# Patient Record
Sex: Female | Born: 1935 | Race: White | Hispanic: No | Marital: Married | Smoking: Never smoker
Health system: Southern US, Community
[De-identification: ages and names within clinical notes are randomized; demographics above are authoritative.]

## PROBLEM LIST (undated history)

## (undated) DIAGNOSIS — F0390 Unspecified dementia without behavioral disturbance: Secondary | ICD-10-CM

## (undated) DIAGNOSIS — I4891 Unspecified atrial fibrillation: Secondary | ICD-10-CM

## (undated) DIAGNOSIS — E119 Type 2 diabetes mellitus without complications: Secondary | ICD-10-CM

## (undated) DIAGNOSIS — I10 Essential (primary) hypertension: Secondary | ICD-10-CM

## (undated) HISTORY — DX: Unspecified atrial fibrillation: I48.91

## (undated) HISTORY — DX: Essential (primary) hypertension: I10

## (undated) HISTORY — DX: Unspecified dementia, unspecified severity, without behavioral disturbance, psychotic disturbance, mood disturbance, and anxiety: F03.90

## (undated) HISTORY — DX: Type 2 diabetes mellitus without complications: E11.9

## (undated) HISTORY — PX: TOTAL HIP ARTHROPLASTY: SHX124

---

## 1968-05-31 HISTORY — PX: TOTAL ABDOMINAL HYSTERECTOMY: SHX209

## 1987-06-01 HISTORY — PX: CARDIAC CATHETERIZATION: SHX172

## 1996-10-29 ENCOUNTER — Encounter (INDEPENDENT_AMBULATORY_CARE_PROVIDER_SITE_OTHER): Payer: Self-pay | Admitting: Internal Medicine

## 1996-10-29 LAB — CONVERTED CEMR LAB: Hgb A1c MFr Bld: 5.7 %

## 1998-07-30 ENCOUNTER — Encounter (INDEPENDENT_AMBULATORY_CARE_PROVIDER_SITE_OTHER): Payer: Self-pay | Admitting: Internal Medicine

## 1998-07-30 LAB — CONVERTED CEMR LAB: Hgb A1c MFr Bld: 7.1 %

## 1999-11-29 ENCOUNTER — Encounter (INDEPENDENT_AMBULATORY_CARE_PROVIDER_SITE_OTHER): Payer: Self-pay | Admitting: Internal Medicine

## 2000-05-31 ENCOUNTER — Encounter (INDEPENDENT_AMBULATORY_CARE_PROVIDER_SITE_OTHER): Payer: Self-pay | Admitting: Internal Medicine

## 2000-05-31 LAB — CONVERTED CEMR LAB: Hgb A1c MFr Bld: 5.7 %

## 2001-05-14 ENCOUNTER — Encounter: Payer: Self-pay | Admitting: Emergency Medicine

## 2001-05-15 ENCOUNTER — Observation Stay (HOSPITAL_COMMUNITY): Admission: EM | Admit: 2001-05-15 | Discharge: 2001-05-15 | Payer: Self-pay | Admitting: Emergency Medicine

## 2001-05-15 ENCOUNTER — Encounter: Payer: Self-pay | Admitting: Internal Medicine

## 2001-05-15 ENCOUNTER — Encounter (INDEPENDENT_AMBULATORY_CARE_PROVIDER_SITE_OTHER): Payer: Self-pay | Admitting: Internal Medicine

## 2001-07-08 ENCOUNTER — Inpatient Hospital Stay (HOSPITAL_COMMUNITY): Admission: EM | Admit: 2001-07-08 | Discharge: 2001-07-10 | Payer: Self-pay

## 2003-03-21 ENCOUNTER — Inpatient Hospital Stay (HOSPITAL_COMMUNITY): Admission: AD | Admit: 2003-03-21 | Discharge: 2003-03-27 | Payer: Self-pay | Admitting: Internal Medicine

## 2003-03-21 ENCOUNTER — Encounter: Payer: Self-pay | Admitting: Internal Medicine

## 2003-10-31 ENCOUNTER — Encounter (INDEPENDENT_AMBULATORY_CARE_PROVIDER_SITE_OTHER): Payer: Self-pay | Admitting: Internal Medicine

## 2003-10-31 LAB — CONVERTED CEMR LAB: Hgb A1c MFr Bld: 6.1 %

## 2003-12-15 ENCOUNTER — Emergency Department (HOSPITAL_COMMUNITY): Admission: EM | Admit: 2003-12-15 | Discharge: 2003-12-15 | Payer: Self-pay | Admitting: Emergency Medicine

## 2004-05-31 HISTORY — PX: VERTEBROPLASTY: SHX113

## 2004-06-21 ENCOUNTER — Inpatient Hospital Stay (HOSPITAL_COMMUNITY): Admission: EM | Admit: 2004-06-21 | Discharge: 2004-06-24 | Payer: Self-pay | Admitting: Emergency Medicine

## 2004-06-23 ENCOUNTER — Encounter (INDEPENDENT_AMBULATORY_CARE_PROVIDER_SITE_OTHER): Payer: Self-pay | Admitting: *Deleted

## 2004-07-01 ENCOUNTER — Emergency Department (HOSPITAL_COMMUNITY): Admission: EM | Admit: 2004-07-01 | Discharge: 2004-07-01 | Payer: Self-pay | Admitting: Emergency Medicine

## 2004-07-06 ENCOUNTER — Emergency Department (HOSPITAL_COMMUNITY): Admission: EM | Admit: 2004-07-06 | Discharge: 2004-07-06 | Payer: Self-pay | Admitting: Emergency Medicine

## 2004-07-09 ENCOUNTER — Emergency Department (HOSPITAL_COMMUNITY): Admission: EM | Admit: 2004-07-09 | Discharge: 2004-07-09 | Payer: Self-pay | Admitting: *Deleted

## 2004-07-10 ENCOUNTER — Ambulatory Visit (HOSPITAL_COMMUNITY): Admission: RE | Admit: 2004-07-10 | Discharge: 2004-07-10 | Payer: Self-pay | Admitting: Interventional Radiology

## 2004-07-15 ENCOUNTER — Ambulatory Visit (HOSPITAL_COMMUNITY): Admission: RE | Admit: 2004-07-15 | Discharge: 2004-07-16 | Payer: Self-pay | Admitting: Interventional Radiology

## 2004-07-15 ENCOUNTER — Encounter (INDEPENDENT_AMBULATORY_CARE_PROVIDER_SITE_OTHER): Payer: Self-pay | Admitting: Specialist

## 2004-07-27 ENCOUNTER — Ambulatory Visit (HOSPITAL_COMMUNITY): Admission: RE | Admit: 2004-07-27 | Discharge: 2004-07-27 | Payer: Self-pay | Admitting: Interventional Radiology

## 2005-06-04 IMAGING — CR DG LUMBAR SPINE COMPLETE 4+V
5 series · 5 of 5 positions shown · non-contrast
Comparison: 06/21/04.

CLINICAL DATA: Low back pain.  No known injury.   Previous vertebroplasty at T12 and L3.  
 COMPLETE LUMBAR SPINE ? 4 VIEWS ? 07/06/04:

[view not recorded (1 of 5)]
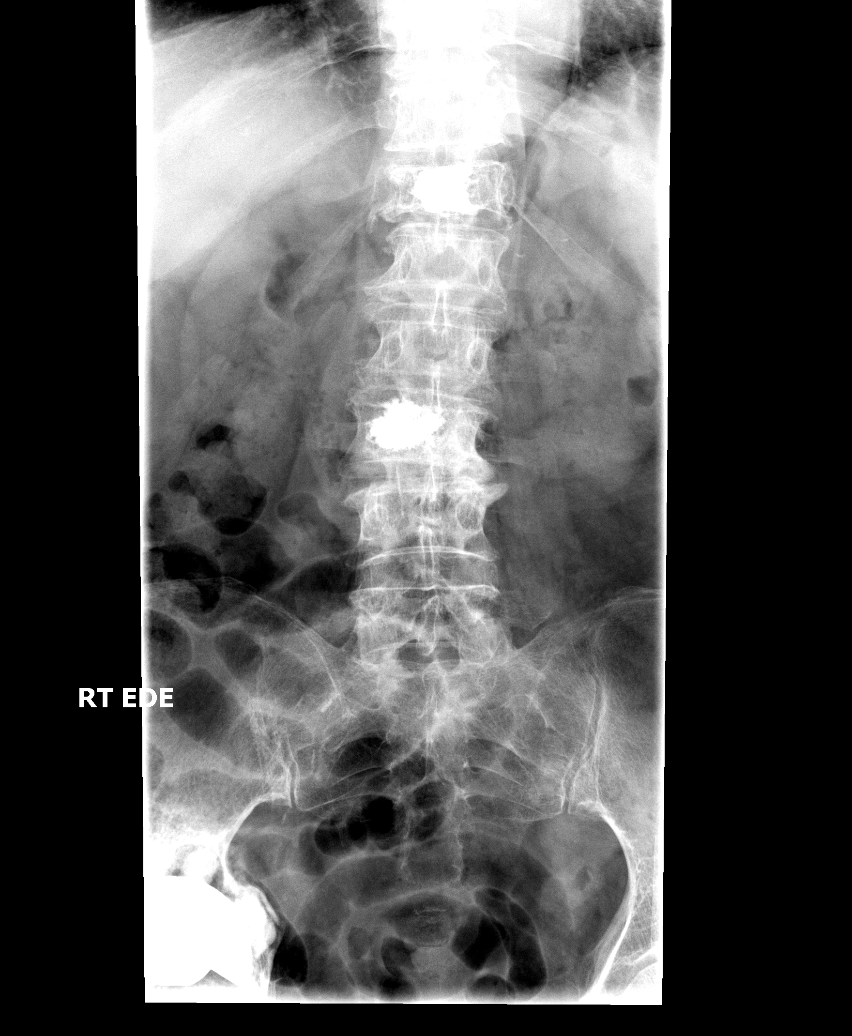

[view not recorded (2 of 5)]
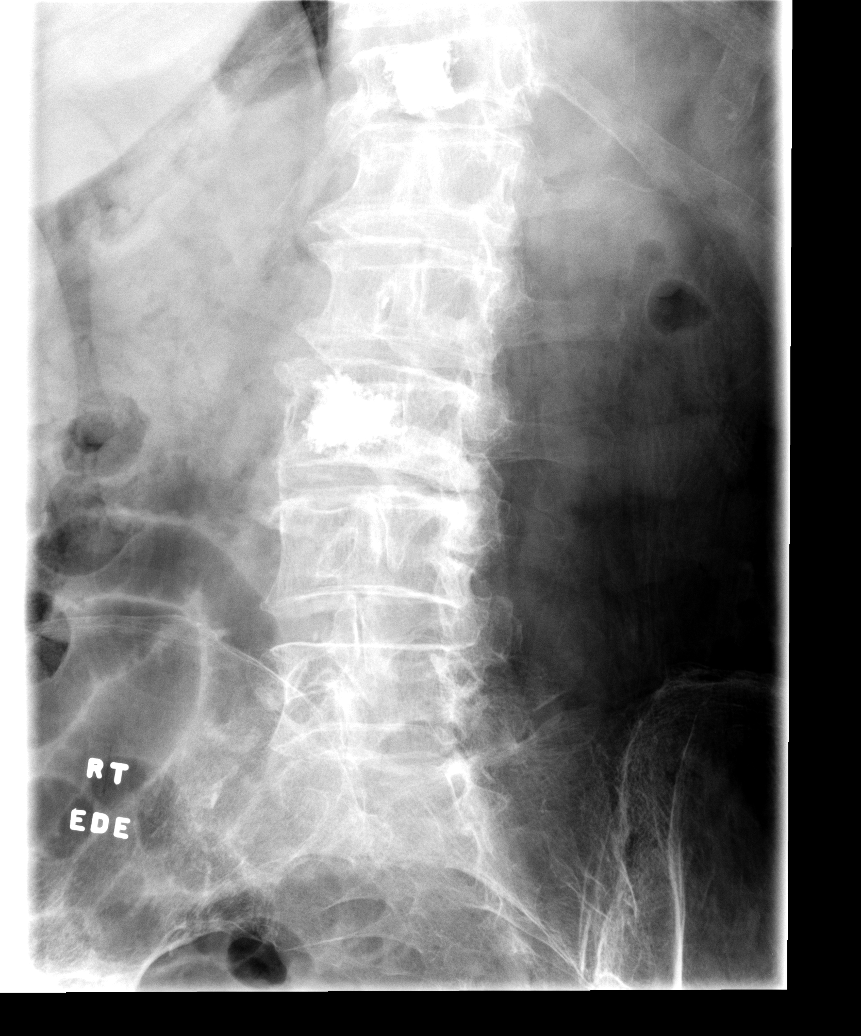

[view not recorded (3 of 5)]
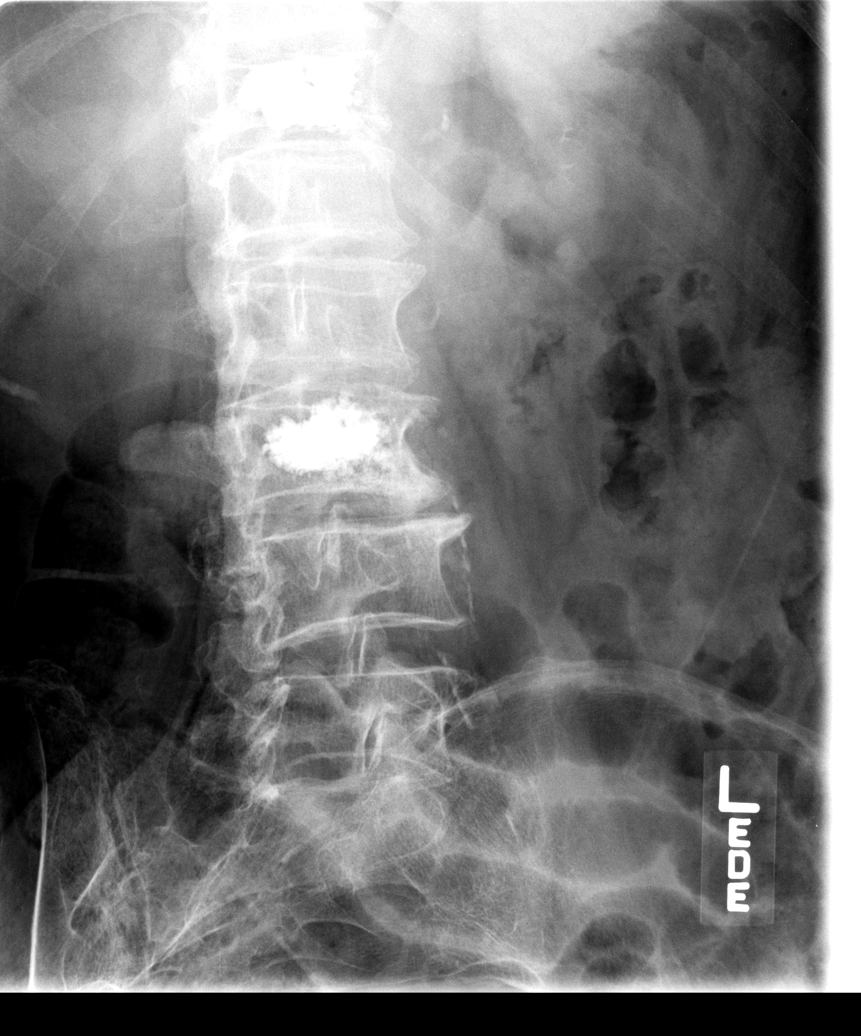

[view not recorded (4 of 5)]
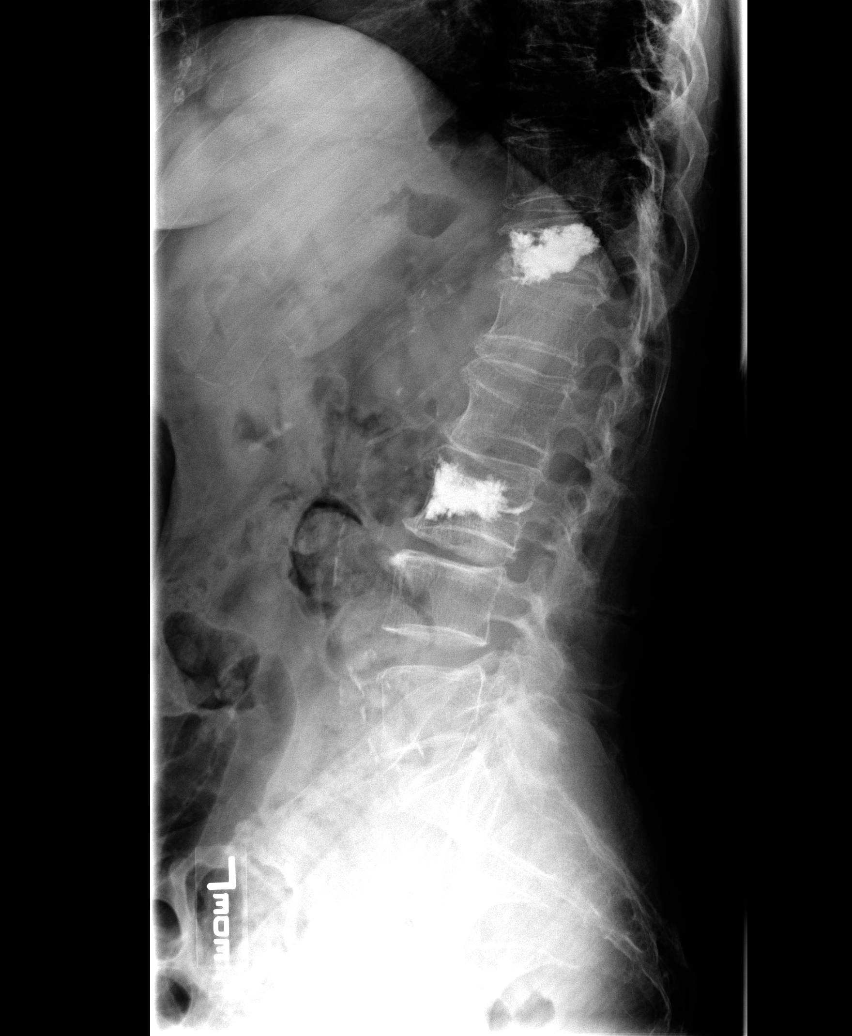

[view not recorded (5 of 5)]
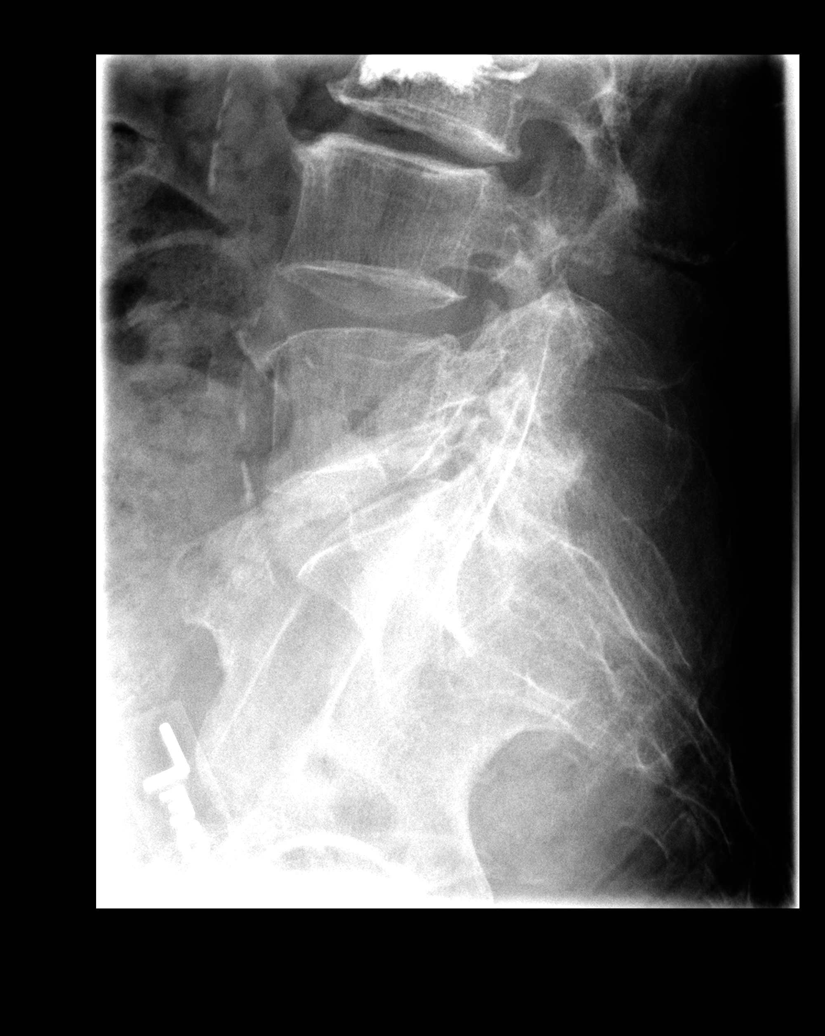

[5 of 5 positions shown; findings below may reference images not displayed]

There is degenerative disk disease as indicated by degenerative changes of the end-plates at L3-4.  There is an old compression deformity of T12 which has increased slightly since the prior study of 06/21/04.  There is diffuse osteopenia but there is no evidence of new disk space narrowing or new compression deformities.
IMPRESSION: Slight increased compression of T12 since the study of 06/21/04.  Previous vertebroplasty at L3 in the interval.  No other changes.

## 2005-09-29 ENCOUNTER — Encounter: Payer: Self-pay | Admitting: Emergency Medicine

## 2006-11-21 ENCOUNTER — Ambulatory Visit: Payer: Self-pay | Admitting: Family Medicine

## 2006-11-21 DIAGNOSIS — I10 Essential (primary) hypertension: Secondary | ICD-10-CM | POA: Insufficient documentation

## 2006-11-21 DIAGNOSIS — E119 Type 2 diabetes mellitus without complications: Secondary | ICD-10-CM | POA: Insufficient documentation

## 2006-11-21 DIAGNOSIS — E7800 Pure hypercholesterolemia, unspecified: Secondary | ICD-10-CM

## 2006-11-24 ENCOUNTER — Encounter (INDEPENDENT_AMBULATORY_CARE_PROVIDER_SITE_OTHER): Payer: Self-pay | Admitting: *Deleted

## 2006-12-12 ENCOUNTER — Emergency Department (HOSPITAL_COMMUNITY): Admission: EM | Admit: 2006-12-12 | Discharge: 2006-12-12 | Payer: Self-pay | Admitting: Emergency Medicine

## 2007-05-17 ENCOUNTER — Encounter (INDEPENDENT_AMBULATORY_CARE_PROVIDER_SITE_OTHER): Payer: Self-pay | Admitting: Internal Medicine

## 2007-05-17 DIAGNOSIS — F341 Dysthymic disorder: Secondary | ICD-10-CM

## 2007-05-17 DIAGNOSIS — M810 Age-related osteoporosis without current pathological fracture: Secondary | ICD-10-CM | POA: Insufficient documentation

## 2007-05-17 DIAGNOSIS — Z96649 Presence of unspecified artificial hip joint: Secondary | ICD-10-CM | POA: Insufficient documentation

## 2007-05-22 ENCOUNTER — Encounter (INDEPENDENT_AMBULATORY_CARE_PROVIDER_SITE_OTHER): Payer: Self-pay | Admitting: *Deleted

## 2007-12-11 ENCOUNTER — Ambulatory Visit: Payer: Self-pay | Admitting: Family Medicine

## 2007-12-19 ENCOUNTER — Encounter (INDEPENDENT_AMBULATORY_CARE_PROVIDER_SITE_OTHER): Payer: Self-pay | Admitting: *Deleted

## 2007-12-19 LAB — CONVERTED CEMR LAB
BUN: 7 mg/dL (ref 6–23)
Chloride: 105 meq/L (ref 96–112)
Cholesterol: 207 mg/dL (ref 0–200)
Creatinine, Ser: 0.8 mg/dL (ref 0.4–1.2)
GFR calc non Af Amer: 72 mL/min
Glucose, Bld: 198 mg/dL — ABNORMAL HIGH (ref 70–99)
VLDL: 38 mg/dL (ref 0–40)

## 2008-01-01 ENCOUNTER — Emergency Department (HOSPITAL_COMMUNITY): Admission: EM | Admit: 2008-01-01 | Discharge: 2008-01-01 | Payer: Self-pay | Admitting: Emergency Medicine

## 2008-01-01 DIAGNOSIS — S91309A Unspecified open wound, unspecified foot, initial encounter: Secondary | ICD-10-CM | POA: Insufficient documentation

## 2008-01-09 ENCOUNTER — Ambulatory Visit: Payer: Self-pay | Admitting: Family Medicine

## 2008-02-22 ENCOUNTER — Encounter (INDEPENDENT_AMBULATORY_CARE_PROVIDER_SITE_OTHER): Payer: Self-pay | Admitting: *Deleted

## 2008-02-29 ENCOUNTER — Encounter (INDEPENDENT_AMBULATORY_CARE_PROVIDER_SITE_OTHER): Payer: Self-pay | Admitting: *Deleted

## 2008-10-09 ENCOUNTER — Telehealth (INDEPENDENT_AMBULATORY_CARE_PROVIDER_SITE_OTHER): Payer: Self-pay | Admitting: Internal Medicine

## 2008-10-10 ENCOUNTER — Ambulatory Visit: Payer: Self-pay | Admitting: Family Medicine

## 2008-10-10 DIAGNOSIS — M79609 Pain in unspecified limb: Secondary | ICD-10-CM

## 2008-10-15 ENCOUNTER — Encounter (INDEPENDENT_AMBULATORY_CARE_PROVIDER_SITE_OTHER): Payer: Self-pay | Admitting: *Deleted

## 2009-02-14 ENCOUNTER — Ambulatory Visit: Payer: Self-pay | Admitting: Family Medicine

## 2009-02-26 LAB — CONVERTED CEMR LAB
AST: 23 units/L (ref 0–37)
BUN: 10 mg/dL (ref 6–23)
CO2: 29 meq/L (ref 19–32)
Chloride: 107 meq/L (ref 96–112)
Cholesterol: 149 mg/dL (ref 0–200)
Creatinine, Ser: 0.8 mg/dL (ref 0.4–1.2)
Glucose, Bld: 171 mg/dL — ABNORMAL HIGH (ref 70–99)
HDL: 50.7 mg/dL (ref >39.00)
LDL Cholesterol: 78 mg/dL (ref 0–99)
Potassium: 3.6 meq/L (ref 3.5–5.1)
Triglycerides: 100 mg/dL (ref 0.0–149.0)
VLDL: 20 mg/dL (ref 0.0–40.0)

## 2009-08-25 ENCOUNTER — Encounter (INDEPENDENT_AMBULATORY_CARE_PROVIDER_SITE_OTHER): Payer: Self-pay | Admitting: *Deleted

## 2010-03-23 ENCOUNTER — Telehealth: Payer: Self-pay | Admitting: Family Medicine

## 2010-03-24 ENCOUNTER — Encounter (INDEPENDENT_AMBULATORY_CARE_PROVIDER_SITE_OTHER): Payer: Self-pay | Admitting: *Deleted

## 2010-06-18 ENCOUNTER — Other Ambulatory Visit: Payer: Self-pay | Admitting: Family Medicine

## 2010-06-18 ENCOUNTER — Ambulatory Visit
Admission: RE | Admit: 2010-06-18 | Discharge: 2010-06-18 | Payer: Self-pay | Source: Home / Self Care | Attending: Family Medicine | Admitting: Family Medicine

## 2010-06-18 DIAGNOSIS — R413 Other amnesia: Secondary | ICD-10-CM | POA: Insufficient documentation

## 2010-06-18 LAB — BASIC METABOLIC PANEL
BUN: 9 mg/dL (ref 6–23)
CO2: 32 mEq/L (ref 19–32)
Calcium: 9.1 mg/dL (ref 8.4–10.5)
Chloride: 98 mEq/L (ref 96–112)
Creatinine, Ser: 0.8 mg/dL (ref 0.4–1.2)
GFR: 76.28 mL/min (ref >60.00)
Glucose, Bld: 233 mg/dL — ABNORMAL HIGH (ref 70–99)
Potassium: 4.1 mEq/L (ref 3.5–5.1)
Sodium: 138 mEq/L (ref 135–145)

## 2010-06-18 LAB — HEPATIC FUNCTION PANEL
ALT: 14 U/L (ref 0–35)
AST: 20 U/L (ref 0–37)
Albumin: 3.8 g/dL (ref 3.5–5.2)
Alkaline Phosphatase: 81 U/L (ref 39–117)
Bilirubin, Direct: 0.1 mg/dL (ref 0.0–0.3)
Total Bilirubin: 0.6 mg/dL (ref 0.3–1.2)
Total Protein: 6.7 g/dL (ref 6.0–8.3)

## 2010-06-18 LAB — TSH: TSH: 2.21 u[IU]/mL (ref 0.35–5.50)

## 2010-06-18 LAB — HEMOGLOBIN A1C: Hgb A1c MFr Bld: 7.1 % — ABNORMAL HIGH (ref 4.6–6.5)

## 2010-06-21 ENCOUNTER — Encounter: Payer: Self-pay | Admitting: Interventional Radiology

## 2010-06-22 ENCOUNTER — Encounter: Payer: Self-pay | Admitting: Family Medicine

## 2010-06-25 ENCOUNTER — Ambulatory Visit: Admit: 2010-06-25 | Payer: Self-pay | Admitting: Family Medicine

## 2010-06-30 NOTE — Letter (Signed)
Summary: Generic Letter  [place] [facility]  [address] [place], [place] [address]   Phone: [phone]  Fax: [phone]    03/24/2010    Lilie WOLTERS [address] [place], [place]  [address]    Dear Ms. Byrom,  We have been unable to reach you by phone.  If your phone number has changed, please notify our office as it is important that we be able to contact  you if necessary.   We have received a request from your pharmacy for your medication.  You are overdue to see the doctor.  Please make an appointment to be seen at your earliest convenience.  We cannot fill your medication until that is done.  Please call [phone] to schedule the appointment and ask the receptionist to make it a 30 minute appointment at Dr. Lianne Bushy request.    Sincerely,   Delilah Shan CMA (AAMA) for Dr. Crawford Givens

## 2010-06-30 NOTE — Progress Notes (Signed)
Summary: Rx Glipizide  Phone Note Refill Request Call back at [phone] Message from:  [facility] on March 23, 2010 8:22 AM  Refills Requested: Medication #1:  GLUCOTROL XL 2.5 MG  TB24 Take 1 tablet by mouth once a day PATIENT HAS NOT BEEN SEEN IN OVER A YEAR. PATIENT WAS SEEING BILLIE BEAN, FNP.  LAST REFILL PATIENT WAS GIVEN #15 AND INSTRUCTED TO SCHEDULE AN APPOINTMENT FOR FURTHER REFILLS.   Method Requested: Electronic Initial call taken by: Sydell Axon LPN,  March 23, 2010 8:25 AM  Follow-up for Phone Call        deny this and have her schedule 30 min appointment.  Follow-up by: Crawford Givens MD,  March 23, 2010 10:43 AM  Additional Follow-up for Phone Call Additional follow up Details #1::        Message left at pharmacy. Tried to phone patient, no answer and no VM.  Will try again.  Lugene Fuquay CMA Christipher Rieger Dull)  March 23, 2010 11:49 AM   Tried to phone patient, no answer and no VM.Lugene Fuquay CMA (AAMA)  March 24, 2010 10:00 AM   Letter mailed.  Additional Follow-up by: Delilah Shan CMA Quinnetta Roepke Dull),  March 24, 2010 10:06 AM

## 2010-06-30 NOTE — Letter (Signed)
Summary: [place] No Show Letter  [place] [facility]  [address] [place], [place] [address]   Phone: [phone]  Fax: [phone]    08/25/2009 MRN: [id number]  Bonnie Shepherd [address] [place], [place]  [address]   Dear Ms. [name],   Our records indicate that you missed your scheduled appointment with ____lab_________________ on _3.28.11___________.  Please contact this office to reschedule your appointment as soon as possible.  It is important that you keep your scheduled appointments with your physician, so we can provide you the best care possible.  Please be advised that there may be a charge for "no show" appointments.    Sincerely,   [place] [facility]

## 2010-07-02 NOTE — Assessment & Plan Note (Signed)
Summary: REFILL MEDS, TRANSFER FROM BILLIE BEAN/ 30 MINS   Vital Signs:  Patient profile:   77 year old female Height:      60.5 inches Weight:      126.75 pounds BMI:     24.43 Temp:     97.3 degrees F oral Pulse rate:   92 / minute Pulse rhythm:   regular BP sitting:   222 / 90  (left arm) Cuff size:   regular  Vitals Entered By: Delilah Shan CMA Duncan Dull) (June 18, 2010 10:19 AM) CC: Refill meds.  She nor the gentleman with her knows what meds she takes.  Pharm. says she hasn't filled anything since Sept., CHF Management   History of Present Illness: HTN:  "I feel pretty good."  No CP, not short of breath, no bilateral lower extremity edema.  Off BP meds.    No acute change in memory.  Not oriented to year.  0/3 on recall.  Couldn't remember what she had for breakfast.  Husband could verify the chronic nature of her symptoms.    DM2- off meds.  No recent sugar checks.  Due for labs.    She has complained of R hip pain and walks with a crutch.  She wanted to know what she could take for pain.  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 Marital Status: Married, 40+ years Children: 4 total (1 from her, 3 from husband) Occupation: retired fom [employer] 1 son and 2 grandsons live with patient one daughter who had been ill died no  smoking, never did no alcohol currently, prev quit before birth of son  Review of Systems       See HPI.  Otherwise negative.    Physical Exam  General:  no apparent distress slightly disheveled normocephalic atraumatic mucous membranes moist neck supple regular rate and rhythm clear to auscultation bilaterally ext w/o edema not oriented to year. 0/3 on recall.    Impression & Recommendations:  Problem # 1:  DIABETES-TYPE 2 (ICD-250.00) check labs and will have patient follow up next week.  we need to work on BP first.  Her updated medication list for this problem includes:    Glucotrol Xl 2.5 Mg Tb24 (Glipizide) .Marland Kitchen... Take 1 tablet by mouth once a day    [employer] Buffered Aspirin 325 Mg Tabs (Aspirin buffered) .Marland Kitchen... Take 1 tablet by mouth once a day    Metformin Hcl 500 Mg Tabs (Metformin hcl) .Marland Kitchen... 1 two times a day for diabetes by mouth  Orders: TLB-A1C / Hgb A1C (Glycohemoglobin) (83036-A1C)  Problem # 2:  ESSENTIAL HYPERTENSION (ICD-401.9) Assessment: Deteriorated Has been off meds for weeks if not months.  Likely chronic.  Will lower slowly.  restart amlodipine.  recheck next week. If emergent symptoms, then to ER.  Husband understood.  Her updated medication list for this problem includes:    Amlodipine  Besylate 10 Mg Tabs (Amlodipine besylate) .Marland Kitchen... 1 by mouth once daily  Orders: Prescription Created Electronically [phone]) Venipuncture [phone]) Specimen Handling (14782) TLB-BMP (Basic Metabolic Panel-BMET) (80048-METABOL) TLB-Hepatic/Liver Function Pnl (80076-HEPATIC) TLB-TSH (Thyroid Stimulating Hormone) (84443-TSH)  Problem # 3:  MEMORY LOSS (ICD-780.93) Chronic.  I would get BP undercontrol and then address this.  See notes on labs.  She has supervision at home with husband.    Complete Medication List: 1)  Imdur 60 Mg Tb24 (Isosorbide mononitrate) .... Take 1 tablet by mouth once a day 2)  Glucotrol Xl 2.5 Mg Tb24 (Glipizide) .... Take 1 tablet by mouth  once a day 3)  [employer]ons Buffered Aspirin 325 Mg Tabs (Aspirin buffered) .... Take 1 tablet by mouth once a day 4)  Metformin Hcl 500 Mg Tabs (Metformin hcl) .Marland Kitchen.. 1 two times a day for diabetes by mouth 5)  Amlodipine Besylate 10 Mg Tabs (Amlodipine besylate) .Marland Kitchen.. 1 by mouth once daily  CHF Assessment/Plan:      The patient's current weight is 126.75 pounds.  Her previous weight was 131.25 pounds.    Patient Instructions: 1)  I want you to start back on the blood pressure medicine- amlodipine 10mg  a day.  Take 1 pill a day. 2)  Come back next week for a appointment.   3)  We'll contact you with your lab report or talk about them next week.  4)  If you have any chest pain or speech changes, go to the hospital.  5)  Take tylenol 325 mg, 2 pills three times a day for hip pain.  Prescriptions: AMLODIPINE BESYLATE 10 MG TABS (AMLODIPINE BESYLATE) 1 by mouth once daily  #90 x 3   Entered and Authorized by:   Crawford Givens MD   Signed by:   Crawford Givens MD on 06/18/2010   Method used:   [facility]        [id number]       [address]       [place], [place]  [address]       Ph: [phone] or [phone]       Fax: [phone]   RxID:   [id number]    Orders Added: 1)  Est. Patient Level IV [33295] 2)  Prescription Created Electronically [G8553] 3)  Venipuncture [18841] 4)  Specimen Handling [99000] 5)  TLB-BMP (Basic Metabolic Panel-BMET) [80048-METABOL] 6)  TLB-Hepatic/Liver Function Pnl [80076-HEPATIC] 7)  TLB-TSH (Thyroid Stimulating Hormone) [84443-TSH] 8)  TLB-A1C / Hgb A1C (Glycohemoglobin) [83036-A1C]    Current Allergies (reviewed today): No known allergies   Appended Document: REFILL MEDS, TRANSFER FROM BILLIE BEAN/ 30 MINS    Clinical Lists Changes  Observations: Added new observation of PEADULT: Crawford Givens MD ~General`Gen appear (06/18/2010 20:47) Added new observation of GEN APPEAR: lipoma noted on back.  not  tender to palpation  (06/18/2010 20:47) Added new observation of PMH FRACTURE: yes (06/18/2010 20:47) Added new observation of CHIEF CMPLNT: Preventive Care (06/18/2010 20:47)          Physical Exam  General:  lipoma noted on back.  not tender to palpation

## 2010-10-15 ENCOUNTER — Other Ambulatory Visit: Payer: Self-pay | Admitting: Family Medicine

## 2010-10-16 NOTE — Discharge Summary (Signed)
NAMESHAUNETTE, [name] NO.:  [id number]   MEDICAL RECORD NO.:  [id number]          PATIENT TYPE:  INP   LOCATION:  5017                         FACILITY:  [facility]   PHYSICIAN:  Burnard Bunting, M.D.    DATE OF BIRTH:  1919/11/24   DATE OF ADMISSION:  06/21/2004  DATE OF DISCHARGE:  06/24/2004                                 DISCHARGE SUMMARY   DISCHARGE DIAGNOSES:  1.  Pubic rami fracture.  2.  T12 compression fracture.  3.  Osteoporosis.  4.  Hypertension.  5.  Diabetes.  6.  Right hip replacement.   HOSPITAL COURSE:  Bonnie Shepherd is an 77 year old female, a community  ambulator, who fell and reported back pain and some right hip pain.  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  She was also noted to have a T12  compression fracture.  She was admitted for pain control.  Vertebroplasty  was performed on June 23, 2004.  The patient has less back pain and was  able to mobilize with physical therapy following that procedure.   CONDITION ON DISCHARGE:  She is discharged home in good condition.   FOLLOW UP:  She will follow up with me in 7-10 days.   DISCHARGE MEDICATIONS:  1.  Preadmission medications.  2.  Percocet for pain.      GSD/MEDQ  D:  08/26/2004  T:  08/26/2004  Job:  [id number]

## 2010-10-16 NOTE — H&P (Signed)
[name], [name] NO.:  [id number]   MEDICAL RECORD NO.:  [id number]          PATIENT TYPE:  EMS   LOCATION:  MAJO                         FACILITY:  [facility]   PHYSICIAN:  Burnard Bunting, M.D.    DATE OF BIRTH:  05/06/1920   DATE OF ADMISSION:  06/21/2004  DATE OF DISCHARGE:                                HISTORY & PHYSICAL   CHIEF COMPLAINT:  Back pain and right hip pain.   HISTORY OF PRESENT ILLNESS:  Bonnie Shepherd is an 77 year old ambulatory female  who lives at home with her husband, who fell last week.  She reports back  pain and right hip pain.  The patient did ambulate into the hospital.  The  patient also reports nonacute numbness and tingling in the bilateral lower  extremities.  She denies any bowel or bladder symptoms.  She has been using  a walker this week.  She denies any loss of consciousness or other medical  problems.   CURRENT MEDICATIONS:  1.  Norvasc 5 mg p.o. daily.  2.  Glipizide 2.5 mg daily.  3.  Isorbid 60 mg p.o. daily.  4.  Aspirin daily.  5.  Fosamax 70 mg weekly.   PAST MEDICAL HISTORY:  1.  Osteoporosis.  2.  Hypertension.  3.  Diabetes.  4.  Increased cholesterol.   PAST SURGICAL HISTORY:  Notable for right total hip replacement 20 years  ago.   ALLERGIES:  No known drug allergies.   PHYSICAL EXAMINATION:  GENERAL:  The patient is alert and oriented.  She  appears comfortable.  She has normal body mass index.  Gait is not observed.  VITAL SIGNS:  Temperature 97.2, pulse 97, respiratory rate 24, blood  pressure 175/69.  CHEST:  Clear to auscultation.  HEART:  Regular rate and rhythm.  ABDOMEN:  Exam is benign.  MUSCULOSKELETAL:  Full range of motion of cervical spine.  Good range of  motion  of wrists, elbows, and shoulders with no crepitus, bruising, or  ecchymosis noted.  LYMPHADENOPATHY:  No cervical, supraclavicular, or axillary lymphadenopathy  is present.  EXTREMITIES:  Radial pulses present bilaterally.  The  patient has mild groin  pain in the right on internal and external rotation of the legs.  No groin,  knee, or ankle pain on the left-hand side with manipulation.  No pitting  edema in the lower extremities.  Pedal pulses are intact.  NEUROLOGIC:  Dorsiflexion and Plantar flexion are intact.  No paresthesias  L1 to S1 bilaterally.  Reflexes 1+ bilateral Achilles, biceps, and triceps.  She is tender over the right ischial tuberosity to direct palpation.  No  real pain in the hip with log rolling or abduction.   LABORATORY DATA AND OTHER STUDIES:  EKG shows normal sinus rhythm.   Other labs are pending.   X-rays show stable acetabular protrusio as well as stable lateral migration  of the tip of the stem in the cortex.  No acute fracture is present.  She  does have what appears to be an acute right pubic rami fracture as well  as  T12 compression fracture.   IMPRESSION:  T12 compression fracture and rami fracture following fall with  stable hip prosthesis.   PLAN:  Admission for pain control as well as MRI scan with radiology consult  and possible vertebroplasty.  Baseline laboratory values will also be  obtained.  Although the hip does appear to have a chronic problem, there is  no acute fracture, and this is something that can be watched.       GSD/MEDQ  D:  06/21/2004  T:  06/21/2004  Job:  [id number]

## 2010-12-07 ENCOUNTER — Encounter: Payer: Self-pay | Admitting: Family Medicine

## 2010-12-08 ENCOUNTER — Ambulatory Visit (INDEPENDENT_AMBULATORY_CARE_PROVIDER_SITE_OTHER): Payer: Medicare Other | Admitting: Family Medicine

## 2010-12-08 ENCOUNTER — Encounter: Payer: Self-pay | Admitting: Family Medicine

## 2010-12-08 VITALS — BP 142/72 | HR 100 | Temp 98.1°F | Wt 124.1 lb

## 2010-12-08 DIAGNOSIS — L723 Sebaceous cyst: Secondary | ICD-10-CM | POA: Insufficient documentation

## 2010-12-08 DIAGNOSIS — E119 Type 2 diabetes mellitus without complications: Secondary | ICD-10-CM

## 2010-12-08 DIAGNOSIS — R05 Cough: Secondary | ICD-10-CM

## 2010-12-08 MED ORDER — METFORMIN HCL 500 MG PO TABS: 500.0000 mg | ORAL_TABLET | Freq: Two times a day (BID) | ORAL | Status: DC

## 2010-12-08 NOTE — Assessment & Plan Note (Addendum)
Packed and recheck in 48h.  Keep clean in meantime.  No need for abx at this point.

## 2010-12-08 NOTE — Patient Instructions (Signed)
Come back and see me on Thursday.  Schedule the appointment before you leave today.  Try to keep the packing in until Thursday.  If it comes out, that's okay.  Wash like you would normally.  Take care.  Start back on the sugar medicine in the meantime.

## 2010-12-09 ENCOUNTER — Encounter: Payer: Self-pay | Admitting: Family Medicine

## 2010-12-09 DIAGNOSIS — R05 Cough: Secondary | ICD-10-CM | POA: Insufficient documentation

## 2010-12-09 NOTE — Progress Notes (Signed)
History per husband.    Off all meds.  Has an occ cough, but no fevers. Isn't sob.  Occ white sputum.  Off all meds except ASA  Draining lesion on L anterior upper abd.  Present for about 1 month per husband and drained about 2 weeks ago.  No fevers.   Meds, vitals, and allergies reviewed.   ROS: See HPI.  Otherwise, noncontributory.  Nad, thin elderly woman Not oriented to year.  At baseline Mmm Poor dentition rrr ctab with occ cough, no wheeze and no focal dec in bs L anterior upper abd with draining lesion, appears to be a chronic sebaceous cyst.  Area clean and explored with peroxide/qtip.  Plug expressed and portion of the sac removed.  Packed with iodoform gauze

## 2010-12-09 NOTE — Assessment & Plan Note (Signed)
Restart metformin

## 2010-12-09 NOTE — Assessment & Plan Note (Signed)
Benign exam, we can follow this clinically.

## 2010-12-10 ENCOUNTER — Ambulatory Visit (INDEPENDENT_AMBULATORY_CARE_PROVIDER_SITE_OTHER): Payer: Medicare Other | Admitting: Family Medicine

## 2010-12-10 ENCOUNTER — Encounter: Payer: Self-pay | Admitting: Family Medicine

## 2010-12-10 DIAGNOSIS — L723 Sebaceous cyst: Secondary | ICD-10-CM

## 2010-12-10 NOTE — Patient Instructions (Signed)
Let me know if the spot hurts more or if it drains white or yellow fluid.  Wash it with soap and water.  Keep taking the sugar medicine. Come back to see me in 3 or 4 months (30 min OV) so I can recheck your blood pressure and sugar.   Take care.

## 2010-12-10 NOTE — Assessment & Plan Note (Signed)
Not repacked.  Nontoxic.  Prev explored and packed. Wash with soap and water and this should gradually heal.  Come back if purulent discharge.  Continue with DM2 meds and f/u as planned.  Husband and pt understood.  Recheck pulse by MD 90.

## 2010-12-10 NOTE — Progress Notes (Signed)
Wound check.  Packing still in.  No fevers.  Dec in pain.  No inc in cough.  Meds, vitals, and allergies reviewed.   ROS: See HPI.  Otherwise, noncontributory.  Nad, thin elderly woman  Not oriented to year. At baseline  Mmm  Poor dentition  rrr  ctab with occ cough, no wheeze and no focal dec in bs  L anterior upper abd with lesion, appears to be a chronic sebaceous cyst- dec in irritation.  Not ttp (improved), minimal drainage.  No purulent material expressed and no portion of the sac noted to remain.

## 2010-12-12 ENCOUNTER — Other Ambulatory Visit: Payer: Self-pay | Admitting: Family Medicine

## 2011-03-16 LAB — URINE CULTURE: Colony Count: 50000

## 2011-03-16 LAB — DIFFERENTIAL
Basophils Absolute: 0.1
Basophils Relative: 1
Eosinophils Absolute: 0.1
Eosinophils Relative: 1
Lymphocytes Relative: 18
Lymphs Abs: 1.3
Monocytes Absolute: 0.4
Monocytes Relative: 6
Neutro Abs: 5.3
Neutrophils Relative %: 75

## 2011-03-16 LAB — URINALYSIS, ROUTINE W REFLEX MICROSCOPIC
Bilirubin Urine: NEGATIVE
Hgb urine dipstick: NEGATIVE
Specific Gravity, Urine: 1.006
pH: 7

## 2011-03-16 LAB — BASIC METABOLIC PANEL
BUN: 10
CO2: 25
Calcium: 8.8
Chloride: 106
Creatinine, Ser: 0.89
GFR calc Af Amer: >60
GFR calc non Af Amer: >60
Glucose, Bld: 177 — ABNORMAL HIGH
Potassium: 3.7
Sodium: 141

## 2011-03-16 LAB — CBC
MCHC: 34.9
Platelets: 216
RBC: 4.58

## 2011-04-12 ENCOUNTER — Ambulatory Visit: Payer: Medicare Other | Admitting: Family Medicine

## 2011-04-12 DIAGNOSIS — Z0289 Encounter for other administrative examinations: Secondary | ICD-10-CM

## 2011-07-19 ENCOUNTER — Other Ambulatory Visit: Payer: Self-pay | Admitting: *Deleted

## 2011-07-19 ENCOUNTER — Other Ambulatory Visit: Payer: Self-pay | Admitting: Family Medicine

## 2011-07-19 DIAGNOSIS — E119 Type 2 diabetes mellitus without complications: Secondary | ICD-10-CM

## 2011-07-19 NOTE — Telephone Encounter (Signed)
Faxed refill request.  This med was not on her meds list but was requested from the pharmacy.  Please advise.

## 2011-07-20 MED ORDER — AMLODIPINE BESYLATE 10 MG PO TABS: 10.0000 mg | ORAL_TABLET | Freq: Every day | ORAL | Status: DC

## 2011-07-20 NOTE — Telephone Encounter (Signed)
Call and schedule OV.  Labs ahead of time.  Thanks.  rx sent.

## 2011-08-25 ENCOUNTER — Other Ambulatory Visit: Payer: Self-pay | Admitting: *Deleted

## 2011-08-25 MED ORDER — METFORMIN HCL 500 MG PO TABS: 500.0000 mg | ORAL_TABLET | Freq: Two times a day (BID) | ORAL | Status: DC

## 2011-08-25 NOTE — Telephone Encounter (Signed)
Received refill request from pharmacy for Metformin. Patient was seen in July and advised to follow-up in 3-4 months. Called and spoke to patient and appointment was scheduled for 09/01/11 at 3:30 for 30 minutes. Is it okay to refill medication until appointment?

## 2011-08-25 NOTE — Telephone Encounter (Signed)
rx sent.  Thanks.  

## 2011-09-01 ENCOUNTER — Ambulatory Visit: Payer: Medicare Other | Admitting: Family Medicine

## 2012-02-25 ENCOUNTER — Other Ambulatory Visit: Payer: Self-pay | Admitting: Family Medicine

## 2012-02-25 NOTE — Telephone Encounter (Signed)
Patient has not been seen in > 1 year.  Please advise.  

## 2012-02-27 NOTE — Telephone Encounter (Signed)
Call her family and set up visit.  She has memory loss.  rx sent in meantime.

## 2012-02-28 NOTE — Telephone Encounter (Signed)
Contacted Bonnie Shepherd.  I asked if I needed to speak with a family member and she said she would write down the appt and give it to her daughter who would bring her.  Appt scheduled 03/07/12 at 3:15.

## 2012-03-07 ENCOUNTER — Ambulatory Visit: Payer: Medicare Other | Admitting: Family Medicine

## 2012-03-17 ENCOUNTER — Ambulatory Visit: Payer: Medicare Other | Admitting: Family Medicine

## 2012-03-20 ENCOUNTER — Encounter: Payer: Self-pay | Admitting: Family Medicine

## 2012-03-20 ENCOUNTER — Ambulatory Visit (INDEPENDENT_AMBULATORY_CARE_PROVIDER_SITE_OTHER): Payer: Medicare Other | Admitting: Family Medicine

## 2012-03-20 VITALS — BP 132/66 | HR 98 | Temp 98.1°F | Wt 127.0 lb

## 2012-03-20 DIAGNOSIS — E119 Type 2 diabetes mellitus without complications: Secondary | ICD-10-CM

## 2012-03-20 DIAGNOSIS — I10 Essential (primary) hypertension: Secondary | ICD-10-CM

## 2012-03-20 DIAGNOSIS — I4891 Unspecified atrial fibrillation: Secondary | ICD-10-CM

## 2012-03-20 DIAGNOSIS — R413 Other amnesia: Secondary | ICD-10-CM

## 2012-03-20 DIAGNOSIS — Z96649 Presence of unspecified artificial hip joint: Secondary | ICD-10-CM

## 2012-03-20 MED ORDER — AMLODIPINE BESYLATE 10 MG PO TABS: 5.0000 mg | ORAL_TABLET | Freq: Every day | ORAL | Status: DC

## 2012-03-20 MED ORDER — METOPROLOL SUCCINATE ER 25 MG PO TB24: 25.0000 mg | ORAL_TABLET | Freq: Every day | ORAL | Status: AC

## 2012-03-20 MED ORDER — TRAMADOL HCL 50 MG PO TABS: 50.0000 mg | ORAL_TABLET | Freq: Two times a day (BID) | ORAL | Status: AC | PRN

## 2012-03-20 NOTE — Patient Instructions (Addendum)
Go to the lab on the way out.  We'll contact you with your lab report. Take 1 metoprolol a day.  Cut the amlodipine in half.  Take 2 baby asprin a day.  Come back to see me for a visit next week.

## 2012-03-21 ENCOUNTER — Encounter: Payer: Self-pay | Admitting: Family Medicine

## 2012-03-21 DIAGNOSIS — I4891 Unspecified atrial fibrillation: Secondary | ICD-10-CM | POA: Insufficient documentation

## 2012-03-21 LAB — COMPREHENSIVE METABOLIC PANEL
AST: 19 U/L (ref 0–37)
Albumin: 3.9 g/dL (ref 3.5–5.2)
BUN: 13 mg/dL (ref 6–23)
CO2: 27 mEq/L (ref 19–32)
Calcium: 9.2 mg/dL (ref 8.4–10.5)
Chloride: 101 mEq/L (ref 96–112)
GFR: 60.19 mL/min (ref >60.00)
Glucose, Bld: 181 mg/dL — ABNORMAL HIGH (ref 70–99)
Potassium: 3.7 mEq/L (ref 3.5–5.1)

## 2012-03-21 LAB — CBC WITH DIFFERENTIAL/PLATELET
Basophils Relative: 1 % (ref 0.0–3.0)
Eosinophils Relative: 0.9 % (ref 0.0–5.0)
Hemoglobin: 14.2 g/dL (ref 12.0–15.0)
Lymphocytes Relative: 16.2 % (ref 12.0–46.0)
Monocytes Relative: 4.1 % (ref 3.0–12.0)
Neutrophils Relative %: 77.8 % — ABNORMAL HIGH (ref 43.0–77.0)
RBC: 4.61 Mil/uL (ref 3.87–5.11)
WBC: 8.9 10*3/uL (ref 4.5–10.5)

## 2012-03-21 LAB — HEMOGLOBIN A1C: Hgb A1c MFr Bld: 6.5 % (ref 4.6–6.5)

## 2012-03-21 NOTE — Assessment & Plan Note (Signed)
With new onset A fib.  Cut amlodipine to 5mg  a day, add on metoprolol.  Will recheck in 1 week. D/w husband about need for f/u.

## 2012-03-21 NOTE — Assessment & Plan Note (Signed)
With pain on ambulation, okay to try tramadol prn in not better with tylenol.  Sedation caution.

## 2012-03-21 NOTE — Assessment & Plan Note (Signed)
Continue 162 mg ASA.  I would not anticoagulate with coumadin given the memory changes. No BLE edema, will attempt rate control. She is episodically tachy by ascultation in the office today.  Recheck 1 week. Med list reviewed in detail with husband.

## 2012-03-21 NOTE — Progress Notes (Signed)
57 h/o lady with h/o memory loss with whom I have stressed f/u with her husband presents for f/u.    Diabetes:  Using medications without difficulties:yes Hypoglycemic episodes:no sx, not checked Hyperglycemic episodes:no sx, not checked Feet problems:no Blood Sugars averaging: not checked Due for labs.   Hypertension:    Using medication without problems or lightheadedness: yes Chest pain with exertion:no Edema:no Short of breath:no  Long standing hip pain after surgery, still using a crutch for ambulation.  Pain sometimes not controlled with tylenol.  This is at baseline.  She and husband are asking about options for pain control.   PMH and SH reviewed  Meds, vitals, and allergies reviewed.   ROS: See HPI.  Otherwise negative.    GEN: nad, alert, doesn't know the year.  HEENT: mucous membranes moist NECK: supple w/o LA CV: IRR PULM: ctab, no inc wob ABD: soft, +bs EXT: no edema SKIN: no acute rash  Diabetic foot exam: Normal inspection No skin breakdown No calluses  Normal DP pulses Normal sensation to light touch and monofilament Nails normal

## 2012-03-21 NOTE — Assessment & Plan Note (Signed)
Began goals of care discussion with husband. I am not confident that patient can make informed decisions.  Full code for now.  D/w husband- if she had a true code situation, with AF, DM, HTN, memory changes, age, she would likely not survive.

## 2012-03-21 NOTE — Assessment & Plan Note (Signed)
Continue metformin for now. See notes on labs.  Diet discussed.  This is controlled by husband due to patient's memory loss.

## 2012-03-23 LAB — TSH: TSH: 2.2 u[IU]/mL (ref 0.35–5.50)

## 2012-03-31 ENCOUNTER — Encounter: Payer: Self-pay | Admitting: Family Medicine

## 2012-03-31 ENCOUNTER — Ambulatory Visit (INDEPENDENT_AMBULATORY_CARE_PROVIDER_SITE_OTHER): Payer: Medicare Other | Admitting: Family Medicine

## 2012-03-31 VITALS — BP 128/78 | HR 84 | Temp 98.0°F

## 2012-03-31 DIAGNOSIS — I4891 Unspecified atrial fibrillation: Secondary | ICD-10-CM

## 2012-03-31 DIAGNOSIS — Z96649 Presence of unspecified artificial hip joint: Secondary | ICD-10-CM

## 2012-03-31 DIAGNOSIS — R413 Other amnesia: Secondary | ICD-10-CM

## 2012-03-31 DIAGNOSIS — I10 Essential (primary) hypertension: Secondary | ICD-10-CM

## 2012-03-31 NOTE — Patient Instructions (Addendum)
Don't change your medicine.   Come back and see me in 6 months.   Take care.  If you have dizziness, lightheadedness or heart racing, then notify the clinic.  Keep taking the aspirin.

## 2012-04-02 ENCOUNTER — Encounter: Payer: Self-pay | Admitting: Family Medicine

## 2012-04-02 NOTE — Assessment & Plan Note (Signed)
I have no indication from the husband that he (or patient) would want more aggressive tx.  I don't see that it would likely change the clinical situation.  D/w pt's husband and pt today about code status and for them to consider. At this point, I don't have clear evidence that patient can make an informed choice.

## 2012-04-02 NOTE — Assessment & Plan Note (Signed)
Pain improved, continue as is.

## 2012-04-02 NOTE — Assessment & Plan Note (Signed)
Controlled, continue as is. No change in meds.  

## 2012-04-02 NOTE — Progress Notes (Signed)
F/u Afib and memory loss.   AF- no c/o CP.  Not sob, no BLE edema.  Tolerating prev med changes, with addition of BB and dec in CCB dose. Feels well.  Not complaining of lightheadedness and this is an improvement.    Dementia. Doesn't know the year, doesn't clearly recall prev OV or afib dx.  D/w pt's husband again, in front of patient.    Hip pain was improved with prn use of tramadol.  No ADE from the medicine.   PMH and SH reviewed  ROS: See HPI, otherwise noncontributory.  Meds, vitals, and allergies reviewed.   GEN: nad, alert, doesn't know the year.  HEENT: mucous membranes moist  NECK: supple w/o LA  CV: IRR, not tachy PULM: ctab, no inc wob  ABD: soft, +bs  EXT: no edema  SKIN: no acute rash

## 2012-04-02 NOTE — Assessment & Plan Note (Signed)
Continue ASA.  Would not anticoagulate given the dementia and fall risk.  Rate controlled. Continue as is. D/w husband about sx to monitor- palpitations, chest pain, SOB, ble edema. He agreed.

## 2012-04-12 ENCOUNTER — Other Ambulatory Visit: Payer: Self-pay | Admitting: Family Medicine

## 2012-08-28 ENCOUNTER — Other Ambulatory Visit: Payer: Self-pay | Admitting: Family Medicine

## 2012-09-28 ENCOUNTER — Other Ambulatory Visit: Payer: Self-pay | Admitting: Family Medicine

## 2013-03-11 ENCOUNTER — Other Ambulatory Visit: Payer: Self-pay | Admitting: Family Medicine

## 2013-03-12 NOTE — Telephone Encounter (Signed)
Last filled 03/31/12, ok to fill?

## 2013-03-13 NOTE — Telephone Encounter (Signed)
Sent, needs OV set up. 30 min.

## 2013-03-13 NOTE — Telephone Encounter (Signed)
Tried calling home and cell number and they are both incorrect or not working, I called the pharmacy to see if they had a number and they had the same cell number which is wrong, he will put a note on the rx to have pt call the office for an appt

## 2014-08-10 ENCOUNTER — Telehealth: Payer: Self-pay | Admitting: Family Medicine

## 2014-08-12 NOTE — Telephone Encounter (Signed)
PLEASE NOTE: All timestamps contained within this report are represented as [place]. CONFIDENTIALTY NOTICE: This fax transmission is intended only for the addressee. It contains information that is legally privileged, confidential or otherwise protected from use or disclosure. If you are not the intended recipient, you are strictly prohibited from reviewing, disclosing, copying using or disseminating any of this information or taking any action in reliance on or regarding this information. If you have received this fax in error, please notify us immediately by telephone so that we can arrange for its return to us. Phone: [phone], Toll-Free: [phone], Fax: [phone] Page: 1 of 1 Call Id: [id number] [facility] - Client TELEPHONE ADVICE RECORD [facility] Patient Name: Bonnie Shepherd Gender: Unknown DOB: 03/01/1919 Age: [age] Y 5 M 10 D Return Phone Number: Address: City/State/Zip: Portal [facility] [place] Physician Raechel Acheuncan, Shaw Contact Type Call Call Type Page Only Caller Name Francis DowseJoel, EMS Relationship To Patient Other Is this call to report lab results? No Return Phone Number Unavailable Initial Comment Caller states the patient has passed and is needing a signature for the death certificate. CBN: [phone] Nurse Assessment Guidelines Guideline Title Affirmed Question Affirmed Notes Nurse Date/Time Lamount Cohen(Eastern Time) Disp. Time Lamount Cohen(Eastern Time) Disposition Final User 08/07/2014 1:05:13 PM Paged On Call back to Call Center - PC Eual Finesrmstrong, Torrey 08/07/2014 1:05:20 PM [facility] Eual Finesrmstrong, Torrey 08/07/2014 1:06:10 [facility] Eual Finesrmstrong, Torrey 08/07/2014 1:15:21 PM Called On-Call Provider Gasper Sellsartin, David 08/07/2014 1:15:33 PM Page Completed Yes Gasper SellsPartin, David After Care Instructions Given Call Event Type User Date  / Time Description Paging DoctorName DoctorPhone DateTime Result/Outcome Notes Raechel AcheDuncan, Shaw [phone] 08/07/2014 1:15:21 PM Called On Call Provider - Reached Raechel AcheDuncan, Shaw 08/07/2014 1:15:23 PM Spoke with On Call - General

## 2014-08-12 NOTE — Telephone Encounter (Signed)
Sent notification to [facility] [place] to update chart.

## 2014-08-14 ENCOUNTER — Telehealth: Payer: Self-pay | Admitting: Family Medicine

## 2014-08-14 NOTE — Telephone Encounter (Signed)
Paperwork received by fax and completed by Dr. Para Marchuncan and faxed back.

## 2014-08-14 NOTE — Telephone Encounter (Signed)
Marylene Landngela @ [facility] called to see if dr Para Marchduncan would sign death certificate for Bonnie Shepherd

## 2014-08-14 NOTE — Telephone Encounter (Signed)
I'll sign it.  Please have them send what they need.  Thanks.

## 2014-08-14 NOTE — Telephone Encounter (Signed)
Bonnie Shepherd at [facility] notified as instructed by telephone. Ernst Spelldvised Sue that Dr. Para Marchuncan will be in the office at 2:00 today.

## 2014-08-30 NOTE — Telephone Encounter (Signed)
Call from EMS.  Patient dead at home, died 05-04-15.  I will sign death certificate.  Per EMS, presumed CVA recently.  No sign of foul play.  I thanked EMS and asked them to give my condolences to her family.  Please process chart.  Thanks.

## 2014-08-30 DEATH — deceased
